# Patient Record
Sex: Female | Born: 1995 | Race: White | Hispanic: No | Marital: Single | Smoking: Never smoker
Health system: Southern US, Community
[De-identification: ages and names within clinical notes are randomized; demographics above are authoritative.]

## PROBLEM LIST (undated history)

## (undated) DIAGNOSIS — E079 Disorder of thyroid, unspecified: Secondary | ICD-10-CM

## (undated) HISTORY — DX: Disorder of thyroid, unspecified: E07.9

---
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
## 2017-01-17 DIAGNOSIS — L700 Acne vulgaris: Secondary | ICD-10-CM | POA: Diagnosis not present

## 2017-01-17 DIAGNOSIS — E611 Iron deficiency: Secondary | ICD-10-CM | POA: Diagnosis not present

## 2017-01-17 DIAGNOSIS — R5383 Other fatigue: Secondary | ICD-10-CM | POA: Diagnosis not present

## 2017-01-17 DIAGNOSIS — E559 Vitamin D deficiency, unspecified: Secondary | ICD-10-CM | POA: Diagnosis not present

## 2017-05-09 ENCOUNTER — Other Ambulatory Visit: Payer: Self-pay

## 2017-05-09 ENCOUNTER — Ambulatory Visit: Payer: BLUE CROSS/BLUE SHIELD | Admitting: Physician Assistant

## 2017-05-09 ENCOUNTER — Encounter: Payer: Self-pay | Admitting: Physician Assistant

## 2017-05-09 VITALS — BP 108/60 | HR 81 | Temp 98.1°F | Resp 18 | Ht 64.45 in | Wt 145.6 lb

## 2017-05-09 DIAGNOSIS — L710 Perioral dermatitis: Secondary | ICD-10-CM

## 2017-05-09 DIAGNOSIS — R21 Rash and other nonspecific skin eruption: Secondary | ICD-10-CM

## 2017-05-09 LAB — POCT SKIN KOH: Skin KOH, POC: NEGATIVE

## 2017-05-09 MED ORDER — METRONIDAZOLE 1 % EX GEL: Freq: Every day | CUTANEOUS | 0 refills | Status: DC

## 2017-05-09 NOTE — Patient Instructions (Addendum)
I am treating you today for Perioral dermatitis.   metronidazole gel once daily, and continue treatment for at least eight weeks. Improvement is often noted within this period, but longer periods of treatment may be required in some patients.  Other treatments are available. If topical doesn't work after 8 weeks, we can switch to oral.   Limit the use of moisturizers.   Perioral dermatitis (POD), also known as periorificial dermatitis, is a skin disorder that typically presents with multiple small inflammatory papules around the mouth, nose, or eyes. Although the name "perioral dermatitis" suggests a primarily eczematous condition, POD most often resembles an acneiform or rosacea-like eruption with or without associated features of a mild eczematous dermatitis. The pathogenesis of POD is poorly understood; both intrinsic factors and extrinsic factors, such as irritants and topical corticosteroids, may contribute to this disorder. Topical anti-inflammatory agents and topical or systemic antibiotics constitute the major pharmacologic options for therapy.  Come back and see me in 6-8 weeks.  Thank you for coming in today. I hope you feel we met your needs.  Feel free to call PCP if you have any questions or further requests.  Please consider signing up for MyChart if you do not already have it, as this is a great way to communicate with me.  Best,  Whitney McVey, PA-C  IF you received an x-ray today, you will receive an invoice from [facility]. Please contact [facility] at [phone] with questions or concerns regarding your invoice.   IF you received labwork today, you will receive an invoice from [facility]. Please contact [facility] at [phone] with questions or concerns regarding your invoice.   Our billing staff will not be able to assist you with questions regarding bills from these companies.  You will be contacted with the lab results as soon as they are available.  The fastest way to get your results is to activate your My Chart account. Instructions are located on the last page of this paperwork. If you have not heard from [place] regarding the results in 2 weeks, please contact this office.

## 2017-05-09 NOTE — Progress Notes (Signed)
   Verneda SkillJillian E Zapanta  MRN: [id number] DOB: [birth year]  PCP: Patient, No Pcp Per  Subjective:  Pt is a pleasant 21 year old female who presents to clinic for rash on face x 3 months.  Started around her mouth and is now on sides of both eye lids - mouth involvement has resolved. Dermatologist is Dr. Fredric MarePatricia Dupre in [place], [place] x 1.5 years.  Was treated for a "build up of yeast" with Ketoconazole.  She is taking Retinoin Topical acne medication.  She has tried tea tree oil - took inflammation away but rash still there. Apple cider vinegar helped a lot - but then didn't help.  Using water and wash cloth for cleansing.  Denies itching, pain, drainage, pustular appearance.   Hasn't noticed food association. Yeast-free diet did not help.    Review of Systems  Constitutional: Negative for chills, diaphoresis, fatigue and fever.  Respiratory: Negative for cough.   Skin: Positive for rash.    There are no active problems to display for this patient.   Current Outpatient Medications on File Prior to Visit  Medication Sig Dispense Refill  . Ascorbic Acid (VITAMIN C) 100 MG tablet Take 100 mg by mouth daily.    . IRON, FERROUS GLUCONATE, PO Take by mouth.    . spironolactone (ALDACTONE) 100 MG tablet Take 100 mg by mouth daily.    Marland Kitchen. doxycycline (VIBRAMYCIN) 100 MG capsule Take 1 capsule (100 mg total) by mouth 2 (two) times daily. (Patient not taking: Reported on 05/06/2015) 60 capsule 5  . Multiple Vitamins-Minerals (MULTIVITAMIN WITH MINERALS) tablet Take 1 tablet by mouth daily.    Marland Kitchen. thyroid (ARMOUR THYROID) 30 MG tablet Take 30 mg by mouth daily before breakfast.    . vitamin B-12 (CYANOCOBALAMIN) 100 MCG tablet Take 50 mcg by mouth daily.    Marland Kitchen. VITAMIN D, CHOLECALCIFEROL, PO Take by mouth.     No current facility-administered medications on file prior to visit.     No Known Allergies   Objective:  BP 108/60 (BP Location: Left Arm, Patient Position: Sitting, Cuff Size: Normal)    Pulse 81   Temp 98.1 F (36.7 C) (Oral)   Resp 18   Ht 5' 4.45" (1.637 m)   Wt 145 lb 9.6 oz (66 kg)   LMP 05/02/2017 (Approximate)   SpO2 98%   BMI 24.65 kg/m   Physical Exam  Constitutional: She is oriented to person, place, and time and well-developed, well-nourished, and in no distress. No distress.  Eyes: Conjunctivae are normal.    Several small pustular papular b/l upper eyelids. No drainage, erythema, scaling. No involvement of eye.   Neurological: She is alert and oriented to person, place, and time. GCS score is 15.  Skin: Skin is warm and dry.  Psychiatric: Mood, memory, affect and judgment normal.  Vitals reviewed.   Results for orders placed or performed in visit on 05/09/17  POCT Skin KOH  Result Value Ref Range   Skin KOH, POC Negative Negative    Assessment and Plan :  1. Perioral dermatitis 2. Rash and nonspecific skin eruption - POCT Skin KOH - metroNIDAZOLE (METROGEL) 1 % gel; Apply topically daily.  Dispense: 45 g; Refill: 0 - pt c/o rash on eyelids x 3 months. Suspect perioral dermatitis due to HPI. Plan to treat with metrogel. RTC in 8 weeks.   Marco CollieWhitney Azayla Polo, PA-C  Primary Care at [facility] 05/09/2017 6:12 PM

## 2017-07-11 ENCOUNTER — Encounter: Payer: Self-pay | Admitting: Emergency Medicine

## 2017-07-11 ENCOUNTER — Ambulatory Visit: Payer: BLUE CROSS/BLUE SHIELD | Admitting: Emergency Medicine

## 2017-07-11 ENCOUNTER — Ambulatory Visit (INDEPENDENT_AMBULATORY_CARE_PROVIDER_SITE_OTHER): Payer: BLUE CROSS/BLUE SHIELD

## 2017-07-11 VITALS — BP 106/76 | HR 66 | Temp 98.0°F | Resp 17 | Ht 66.0 in | Wt 152.0 lb

## 2017-07-11 DIAGNOSIS — M25561 Pain in right knee: Secondary | ICD-10-CM

## 2017-07-11 DIAGNOSIS — S86911A Strain of unspecified muscle(s) and tendon(s) at lower leg level, right leg, initial encounter: Secondary | ICD-10-CM

## 2017-07-11 NOTE — Progress Notes (Signed)
Rhonda Gonzales 22 y.o.   Chief Complaint  Patient presents with  . Knee Pain    right     HISTORY OF PRESENT ILLNESS: This is a 22 y.o. female complaining of pain to the right knee for several days.  May have injured it at work.  Probably a twisting injury.  Complaining of pain and swelling.  No fever, chills, redness, or signs of a septic joint.  HPI   Prior to Admission medications   Medication Sig Start Date End Date Taking? Authorizing Provider  Ascorbic Acid (VITAMIN C) 100 MG tablet Take 100 mg by mouth daily.   Yes [provider]  IRON, FERROUS GLUCONATE, PO Take by mouth.   Yes [provider]  metroNIDAZOLE (METROGEL) 1 % gel Apply topically daily. 05/09/17  Yes McVey, Madelaine Bhat, PA-C  Multiple Vitamins-Minerals (MULTIVITAMIN WITH MINERALS) tablet Take 1 tablet by mouth daily.   Yes [provider]  spironolactone (ALDACTONE) 100 MG tablet Take 100 mg by mouth daily.   Yes [provider]  thyroid (ARMOUR THYROID) 30 MG tablet Take 30 mg by mouth daily before breakfast.   Yes [provider]  vitamin B-12 (CYANOCOBALAMIN) 100 MCG tablet Take 50 mcg by mouth daily.   Yes [provider]  VITAMIN D, CHOLECALCIFEROL, PO Take by mouth.   Yes [provider]    No Known Allergies  There are no active problems to display for this patient.   Past Medical History:  Diagnosis Date  . Thyroid disease     No past surgical history on file.  Social History   Socioeconomic History  . Marital status: Single    Spouse name: Not on file  . Number of children: Not on file  . Years of education: Not on file  . Highest education level: Not on file  Social Needs  . Financial resource strain: Not on file  . Food insecurity - worry: Not on file  . Food insecurity - inability: Not on file  . Transportation needs - medical: Not on file  . Transportation needs - non-medical: Not on file  Occupational History   . Not on file  Tobacco Use  . Smoking status: Never Smoker  . Smokeless tobacco: Never Used  Substance and Sexual Activity  . Alcohol use: No  . Drug use: No  . Sexual activity: No    Birth control/protection: Abstinence  Other Topics Concern  . Not on file  Social History Narrative  . Not on file    Family History  Problem Relation Age of Onset  . Heart disease Paternal Grandmother   . Diabetes Paternal Grandmother   . Heart disease Paternal Grandfather      Review of Systems  Constitutional: Negative.  Negative for chills and fever.  HENT: Negative.  Negative for sore throat.   Eyes: Negative.  Negative for discharge and redness.  Respiratory: Negative.  Negative for cough and shortness of breath.   Cardiovascular: Negative.  Negative for chest pain, palpitations and leg swelling.  Gastrointestinal: Negative.  Negative for nausea and vomiting.  Genitourinary: Negative.  Negative for dysuria and hematuria.  Musculoskeletal: Positive for joint pain (Right knee).  Skin: Negative.  Negative for rash.  Neurological: Negative.  Negative for dizziness, sensory change, focal weakness and headaches.  Endo/Heme/Allergies: Negative.   All other systems reviewed and are negative.  Vitals:   07/11/17 1721  BP: 106/76  Pulse: 66  Resp: 17  Temp: 98 F (36.7 C)  SpO2: 98%     Physical Exam  Constitutional: She is oriented to person, place, and time. She appears well-developed and well-nourished.  HENT:  Head: Normocephalic and atraumatic.  Eyes: EOM are normal. Pupils are equal, round, and reactive to light.  Neck: Normal range of motion.  Cardiovascular: Normal rate.  Pulmonary/Chest: Effort normal.  Musculoskeletal:  Right knee: No bruising or erythema.  Mild swelling.  No localized tenderness.  Stable in flexion and extension.  Full range of motion.  Neurological: She is alert and oriented to person, place, and time. No sensory deficit. She exhibits normal muscle  tone.  Skin: Skin is warm. Capillary refill takes less than 2 seconds.  Psychiatric: She has a normal mood and affect. Her behavior is normal.  Vitals reviewed.  Dg Knee Complete 4 Views Right  Result Date: 07/11/2017 CLINICAL DATA:  Acute pain of the right knee for the past 6 days. EXAM: RIGHT KNEE - COMPLETE 4+ VIEW COMPARISON:  Right knee radiograph 09/27/2010. FINDINGS: No evidence of fracture, dislocation, or joint effusion. Previously noted lucent lesion in the proximal tibial metadiaphyseal region is now mildly sclerotic, compatible with a healed fibrous cortical defect. No evidence of arthropathy. Soft tissues are unremarkable. IMPRESSION: 1. No acute radiographic abnormality of the right knee. Electronically Signed   By: Trudie Reedaniel  Entrikin M.D.   On: 07/11/2017 17:52     ASSESSMENT & PLAN: Rhonda GlanceJillian was seen today for knee pain.  Diagnoses and all orders for this visit:  Acute pain of right knee -     DG Knee Complete 4 Views Right; Future  Knee strain, right, initial encounter    Patient Instructions       IF you received an x-ray today, you will receive an invoice from [facility]. Please contact [facility] at [phone] with questions or concerns regarding your invoice.   IF you received labwork today, you will receive an invoice from [facility]. Please contact [facility] at [phone] with questions or concerns regarding your invoice.   Our billing staff will not be able to assist you with questions regarding bills from these companies.  You will be contacted with the lab results as soon as they are available. The fastest way to get your results is to activate your My Chart account. Instructions are located on the last page of this paperwork. If you have not heard from us regarding the results in 2 weeks, please contact this office.      Knee Pain, Adult Many things can cause knee pain. The pain often goes away on its own with time and rest. If the  pain does not go away, tests may be done to find out what is causing the pain. Follow these instructions at home: Activity  Rest your knee.  Do not do things that cause pain.  Avoid activities where both feet leave the ground at the same time (high-impact activities). Examples are running, jumping rope, and doing jumping jacks. General instructions  Take medicines only as told by your doctor.  Raise (elevate) your knee when you are resting. Make sure your knee is higher than your heart.  Sleep with a pillow under your knee.  If told, put ice on the knee: ? Put ice in a plastic bag. ? Place a towel between your skin and the bag. ? Leave the ice on for 20 minutes, 2-3 times a day.  Ask your doctor if you should wear an elastic knee support.  Lose weight if you are overweight. Being  overweight can make your knee hurt more.  Do not use any tobacco products. These include cigarettes, chewing tobacco, or electronic cigarettes. If you need help quitting, ask your doctor. Smoking may slow down healing. Contact a doctor if:  The pain does not stop.  The pain changes or gets worse.  You have a fever along with knee pain.  Your knee gives out or locks up.  Your knee swells, and becomes worse. Get help right away if:  Your knee feels warm.  You cannot move your knee.  You have very bad knee pain.  You have chest pain.  You have trouble breathing. Summary  Many things can cause knee pain. The pain often goes away on its own with time and rest.  Avoid activities that put stress on your knee. These include running and jumping rope.  Get help right away if you cannot move your knee, or if your knee feels warm, or if you have trouble breathing. This information is not intended to replace advice given to you by your health care provider. Make sure you discuss any questions you have with your health care provider. Document Released: 08/11/2008 Document Revised: 05/09/2016  Document Reviewed: 05/09/2016 Elsevier Interactive Patient Education  2017 Elsevier Inc.      Edwina Barth, MD [facility]

## 2017-07-11 NOTE — Patient Instructions (Addendum)
     IF you received an x-ray today, you will receive an invoice from [facility]. Please contact [facility] at [phone] with questions or concerns regarding your invoice.   IF you received labwork today, you will receive an invoice from [facility]. Please contact [facility] at [phone] with questions or concerns regarding your invoice.   Our billing staff will not be able to assist you with questions regarding bills from these companies.  You will be contacted with the lab results as soon as they are available. The fastest way to get your results is to activate your My Chart account. Instructions are located on the last page of this paperwork. If you have not heard from us regarding the results in 2 weeks, please contact this office.      Knee Pain, Adult Many things can cause knee pain. The pain often goes away on its own with time and rest. If the pain does not go away, tests may be done to find out what is causing the pain. Follow these instructions at home: Activity  Rest your knee.  Do not do things that cause pain.  Avoid activities where both feet leave the ground at the same time (high-impact activities). Examples are running, jumping rope, and doing jumping jacks. General instructions  Take medicines only as told by your doctor.  Raise (elevate) your knee when you are resting. Make sure your knee is higher than your heart.  Sleep with a pillow under your knee.  If told, put ice on the knee: ? Put ice in a plastic bag. ? Place a towel between your skin and the bag. ? Leave the ice on for 20 minutes, 2-3 times a day.  Ask your doctor if you should wear an elastic knee support.  Lose weight if you are overweight. Being overweight can make your knee hurt more.  Do not use any tobacco products. These include cigarettes, chewing tobacco, or electronic cigarettes. If you need help quitting, ask your doctor. Smoking may slow down healing. Contact a  doctor if:  The pain does not stop.  The pain changes or gets worse.  You have a fever along with knee pain.  Your knee gives out or locks up.  Your knee swells, and becomes worse. Get help right away if:  Your knee feels warm.  You cannot move your knee.  You have very bad knee pain.  You have chest pain.  You have trouble breathing. Summary  Many things can cause knee pain. The pain often goes away on its own with time and rest.  Avoid activities that put stress on your knee. These include running and jumping rope.  Get help right away if you cannot move your knee, or if your knee feels warm, or if you have trouble breathing. This information is not intended to replace advice given to you by your health care provider. Make sure you discuss any questions you have with your health care provider. Document Released: 08/11/2008 Document Revised: 05/09/2016 Document Reviewed: 05/09/2016 Elsevier Interactive Patient Education  2017 Elsevier Inc.  

## 2017-08-29 ENCOUNTER — Other Ambulatory Visit: Payer: Self-pay

## 2017-08-29 ENCOUNTER — Encounter: Payer: Self-pay | Admitting: Emergency Medicine

## 2017-08-29 ENCOUNTER — Ambulatory Visit: Payer: BLUE CROSS/BLUE SHIELD | Admitting: Emergency Medicine

## 2017-08-29 VITALS — BP 100/60 | HR 69 | Resp 18 | Ht 66.0 in | Wt 153.4 lb

## 2017-08-29 DIAGNOSIS — M25561 Pain in right knee: Secondary | ICD-10-CM

## 2017-08-29 DIAGNOSIS — S86911D Strain of unspecified muscle(s) and tendon(s) at lower leg level, right leg, subsequent encounter: Secondary | ICD-10-CM | POA: Diagnosis not present

## 2017-08-29 NOTE — Progress Notes (Signed)
Rhonda Gonzales 22 y.o.   Chief Complaint  Patient presents with  . Knee Swelling    right knee, pt states swelling has been going on since February. Pt states she doesn't have any pain when she walks just some discomfort. Pt states knee is not discolored or warm to the touch.    HISTORY OF PRESENT ILLNESS: This is a 22 y.o. female here for follow-up of right knee injury sustained about 2 months ago.  Complaining of persistent pain and swelling.  No new injury.  No new symptoms.  HPI   Prior to Admission medications   Medication Sig Start Date End Date Taking? Authorizing Provider  Ascorbic Acid (VITAMIN C) 100 MG tablet Take 100 mg by mouth daily.   Yes [provider]  IRON, FERROUS GLUCONATE, PO Take by mouth.   Yes [provider]  metroNIDAZOLE (METROGEL) 1 % gel Apply topically daily. 05/09/17  Yes McVey, Madelaine Bhat, PA-C  Multiple Vitamins-Minerals (MULTIVITAMIN WITH MINERALS) tablet Take 1 tablet by mouth daily.   Yes [provider]  spironolactone (ALDACTONE) 100 MG tablet Take 100 mg by mouth daily.   Yes [provider]  thyroid (ARMOUR THYROID) 30 MG tablet Take 30 mg by mouth daily before breakfast.   Yes [provider]  vitamin B-12 (CYANOCOBALAMIN) 100 MCG tablet Take 50 mcg by mouth daily.   Yes [provider]  VITAMIN D, CHOLECALCIFEROL, PO Take by mouth.   Yes [provider]    No Known Allergies  Patient Active Problem List   Diagnosis Date Noted  . Acute pain of right knee 07/11/2017  . Knee strain, right, initial encounter 07/11/2017    Past Medical History:  Diagnosis Date  . Thyroid disease     History reviewed. No pertinent surgical history.  Social History   Socioeconomic History  . Marital status: Single    Spouse name: Not on file  . Number of children: Not on file  . Years of education: Not on file  . Highest education level: Not on file  Occupational History  .  Not on file  Social Needs  . Financial resource strain: Not on file  . Food insecurity:    Worry: Not on file    Inability: Not on file  . Transportation needs:    Medical: Not on file    Non-medical: Not on file  Tobacco Use  . Smoking status: Never Smoker  . Smokeless tobacco: Never Used  Substance and Sexual Activity  . Alcohol use: No  . Drug use: No  . Sexual activity: Never    Birth control/protection: Abstinence  Lifestyle  . Physical activity:    Days per week: Not on file    Minutes per session: Not on file  . Stress: Not on file  Relationships  . Social connections:    Talks on phone: Not on file    Gets together: Not on file    Attends religious service: Not on file    Active member of club or organization: Not on file    Attends meetings of clubs or organizations: Not on file    Relationship status: Not on file  . Intimate partner violence:    Fear of current or ex partner: Not on file    Emotionally abused: Not on file    Physically abused: Not on file    Forced sexual activity: Not on file  Other Topics Concern  . Not on file  Social History Narrative  .  Not on file    Family History  Problem Relation Age of Onset  . Heart disease Paternal Grandmother   . Diabetes Paternal Grandmother   . Heart disease Paternal Grandfather      Review of Systems  Constitutional: Negative.  Negative for chills and fever.  HENT: Negative for sore throat.   Respiratory: Negative for shortness of breath.   Cardiovascular: Negative for chest pain.  Gastrointestinal: Negative for abdominal pain, nausea and vomiting.  Musculoskeletal: Positive for joint pain (Right knee).  Skin: Negative.  Negative for rash.  Neurological: Negative.  Negative for sensory change and focal weakness.  All other systems reviewed and are negative.     Vitals:   08/29/17 1741  BP: 100/60  Pulse: 69  Resp: 18  SpO2: 100%    Physical Exam  Constitutional: She is oriented to  person, place, and time. She appears well-developed and well-nourished.  HENT:  Head: Normocephalic and atraumatic.  Eyes: Pupils are equal, round, and reactive to light.  Neck: Normal range of motion.  Cardiovascular: Normal rate.  Pulmonary/Chest: Effort normal.  Musculoskeletal:  Right knee: No erythema or significant swelling.  No bruising.  Full range of motion.  Stable in flexion and extension.  Neurological: She is alert and oriented to person, place, and time. No sensory deficit. She exhibits normal muscle tone.  Skin: Skin is warm and dry. Capillary refill takes less than 2 seconds.  Psychiatric: She has a normal mood and affect. Her behavior is normal.  Vitals reviewed.    ASSESSMENT & PLAN:  Rhonda Gonzales was seen today for knee swelling.  Diagnoses and all orders for this visit:  Acute pain of right knee -     MR Knee Right Wo Contrast; Future -     Ambulatory referral to Orthopedic Surgery  Strain of right knee, subsequent encounter    Patient Instructions       IF you received an x-ray today, you will receive an invoice from [facility]. Please contact [facility] at [phone] with questions or concerns regarding your invoice.   IF you received labwork today, you will receive an invoice from [facility]. Please contact [facility] at [phone] with questions or concerns regarding your invoice.   Our billing staff will not be able to assist you with questions regarding bills from these companies.  You will be contacted with the lab results as soon as they are available. The fastest way to get your results is to activate your My Chart account. Instructions are located on the last page of this paperwork. If you have not heard from [place] regarding the results in 2 weeks, please contact this office.     Knee Pain, Adult Many things can cause knee pain. The pain often goes away on its own with time and rest. If the pain does not go away, tests may be  done to find out what is causing the pain. Follow these instructions at home: Activity  Rest your knee.  Do not do things that cause pain.  Avoid activities where both feet leave the ground at the same time (high-impact activities). Examples are running, jumping rope, and doing jumping jacks. General instructions  Take medicines only as told by your doctor.  Raise (elevate) your knee when you are resting. Make sure your knee is higher than your heart.  Sleep with a pillow under your knee.  If told, put ice on the knee: ? Put ice in a plastic bag. ? Place a towel between  your skin and the bag. ? Leave the ice on for 20 minutes, 2-3 times a day.  Ask your doctor if you should wear an elastic knee support.  Lose weight if you are overweight. Being overweight can make your knee hurt more.  Do not use any tobacco products. These include cigarettes, chewing tobacco, or electronic cigarettes. If you need help quitting, ask your doctor. Smoking may slow down healing. Contact a doctor if:  The pain does not stop.  The pain changes or gets worse.  You have a fever along with knee pain.  Your knee gives out or locks up.  Your knee swells, and becomes worse. Get help right away if:  Your knee feels warm.  You cannot move your knee.  You have very bad knee pain.  You have chest pain.  You have trouble breathing. Summary  Many things can cause knee pain. The pain often goes away on its own with time and rest.  Avoid activities that put stress on your knee. These include running and jumping rope.  Get help right away if you cannot move your knee, or if your knee feels warm, or if you have trouble breathing. This information is not intended to replace advice given to you by your health care provider. Make sure you discuss any questions you have with your health care provider. Document Released: 08/11/2008 Document Revised: 05/09/2016 Document Reviewed: 05/09/2016 Elsevier  Interactive Patient Education  2017 Elsevier Inc.     Edwina BarthMiguel Dakotah Orrego, MD [facility]

## 2017-08-29 NOTE — Patient Instructions (Addendum)
     IF you received an x-ray today, you will receive an invoice from [facility]. Please contact [facility] at [phone] with questions or concerns regarding your invoice.   IF you received labwork today, you will receive an invoice from [facility]. Please contact [facility] at [phone] with questions or concerns regarding your invoice.   Our billing staff will not be able to assist you with questions regarding bills from these companies.  You will be contacted with the lab results as soon as they are available. The fastest way to get your results is to activate your My Chart account. Instructions are located on the last page of this paperwork. If you have not heard from us regarding the results in 2 weeks, please contact this office.      Knee Pain, Adult Many things can cause knee pain. The pain often goes away on its own with time and rest. If the pain does not go away, tests may be done to find out what is causing the pain. Follow these instructions at home: Activity  Rest your knee.  Do not do things that cause pain.  Avoid activities where both feet leave the ground at the same time (high-impact activities). Examples are running, jumping rope, and doing jumping jacks. General instructions  Take medicines only as told by your doctor.  Raise (elevate) your knee when you are resting. Make sure your knee is higher than your heart.  Sleep with a pillow under your knee.  If told, put ice on the knee: ? Put ice in a plastic bag. ? Place a towel between your skin and the bag. ? Leave the ice on for 20 minutes, 2-3 times a day.  Ask your doctor if you should wear an elastic knee support.  Lose weight if you are overweight. Being overweight can make your knee hurt more.  Do not use any tobacco products. These include cigarettes, chewing tobacco, or electronic cigarettes. If you need help quitting, ask your doctor. Smoking may slow down healing. Contact a  doctor if:  The pain does not stop.  The pain changes or gets worse.  You have a fever along with knee pain.  Your knee gives out or locks up.  Your knee swells, and becomes worse. Get help right away if:  Your knee feels warm.  You cannot move your knee.  You have very bad knee pain.  You have chest pain.  You have trouble breathing. Summary  Many things can cause knee pain. The pain often goes away on its own with time and rest.  Avoid activities that put stress on your knee. These include running and jumping rope.  Get help right away if you cannot move your knee, or if your knee feels warm, or if you have trouble breathing. This information is not intended to replace advice given to you by your health care provider. Make sure you discuss any questions you have with your health care provider. Document Released: 08/11/2008 Document Revised: 05/09/2016 Document Reviewed: 05/09/2016 Elsevier Interactive Patient Education  2017 Elsevier Inc.  

## 2017-09-24 DIAGNOSIS — E611 Iron deficiency: Secondary | ICD-10-CM | POA: Diagnosis not present

## 2017-09-24 DIAGNOSIS — R5383 Other fatigue: Secondary | ICD-10-CM | POA: Diagnosis not present

## 2017-09-24 DIAGNOSIS — L700 Acne vulgaris: Secondary | ICD-10-CM | POA: Diagnosis not present

## 2017-09-24 DIAGNOSIS — E559 Vitamin D deficiency, unspecified: Secondary | ICD-10-CM | POA: Diagnosis not present

## 2017-11-20 DIAGNOSIS — E559 Vitamin D deficiency, unspecified: Secondary | ICD-10-CM | POA: Diagnosis not present

## 2018-02-26 IMAGING — DX DG KNEE COMPLETE 4+V*R*
4 series · 4 of 4 positions shown · non-contrast
Comparison: Right knee radiograph 09/27/2010.

CLINICAL DATA: Acute pain of the right knee for the past 6 days.

EXAM:
RIGHT KNEE - COMPLETE 4+ VIEW

[knee ap]
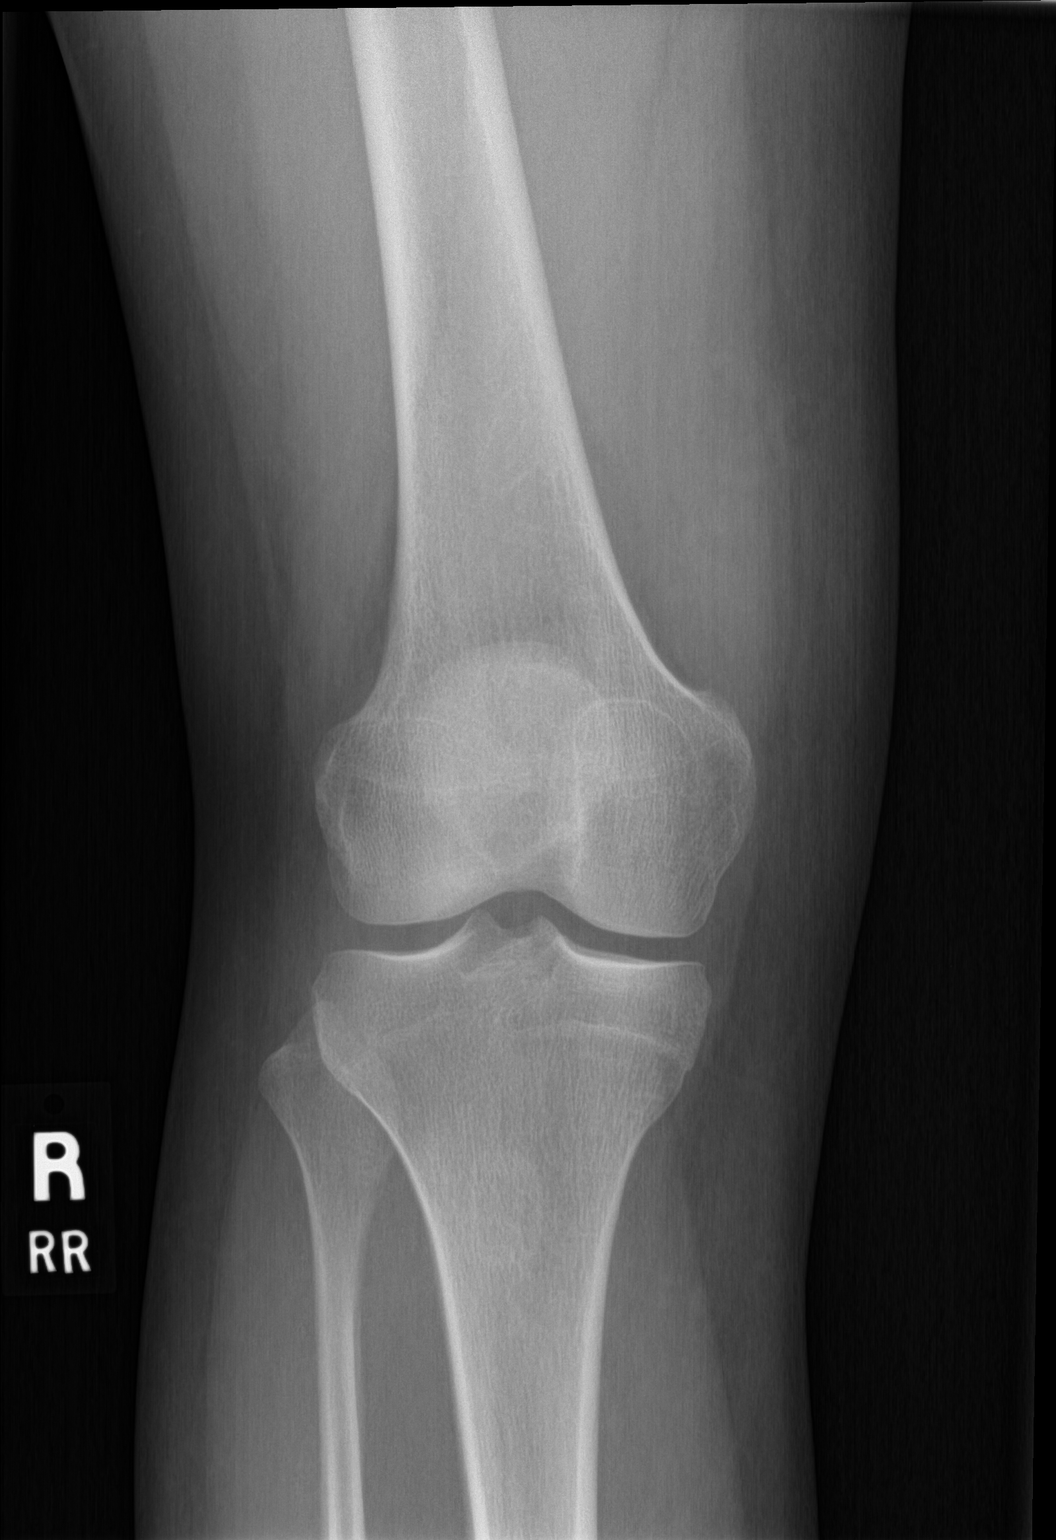

[knee obl (1 of 2)]
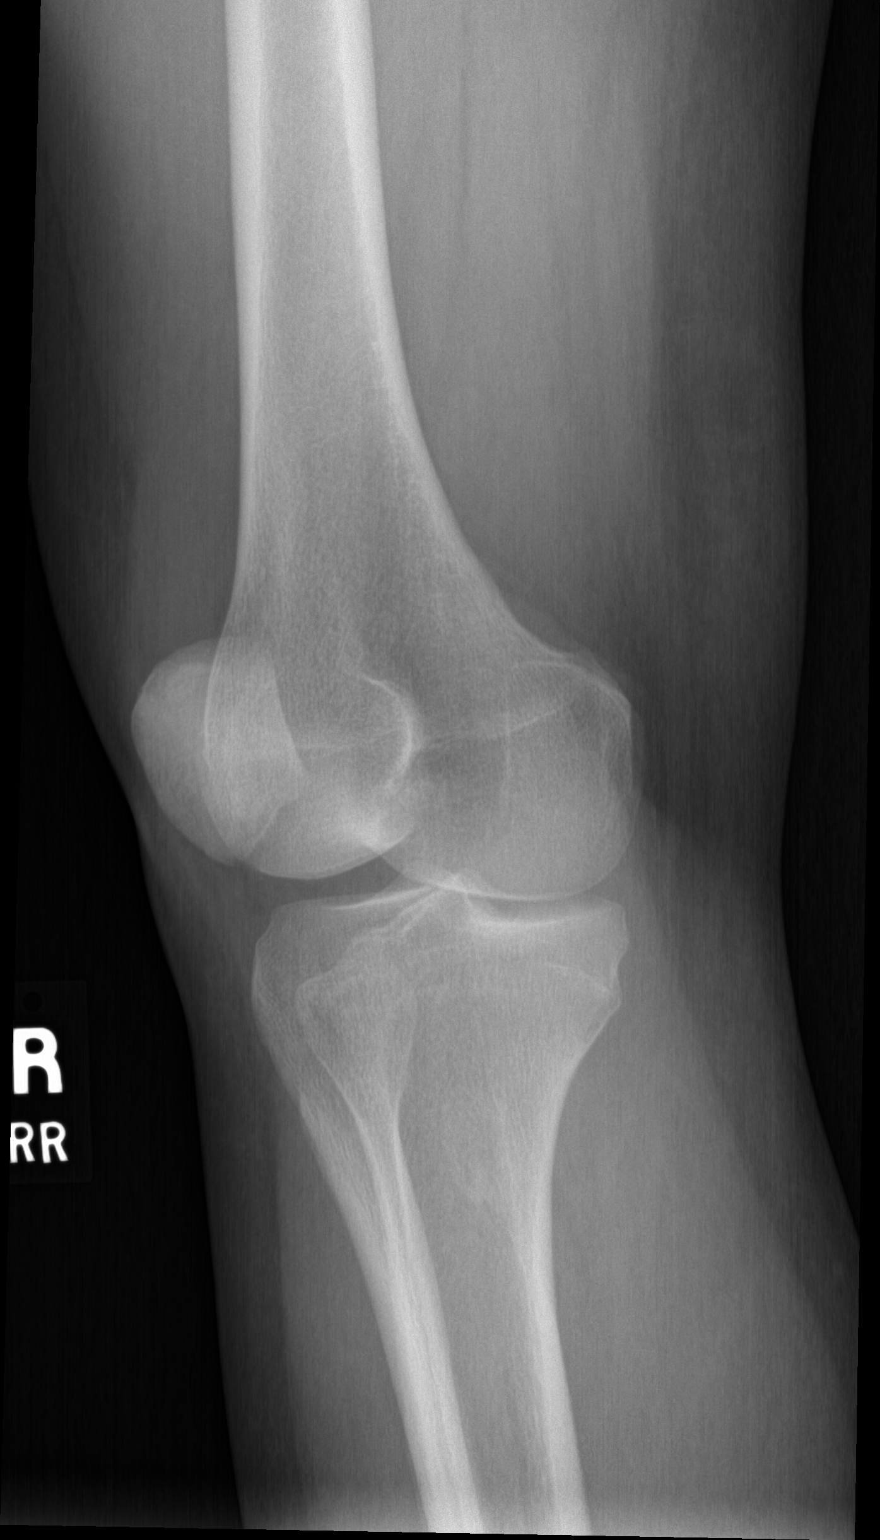

[knee obl (2 of 2)]
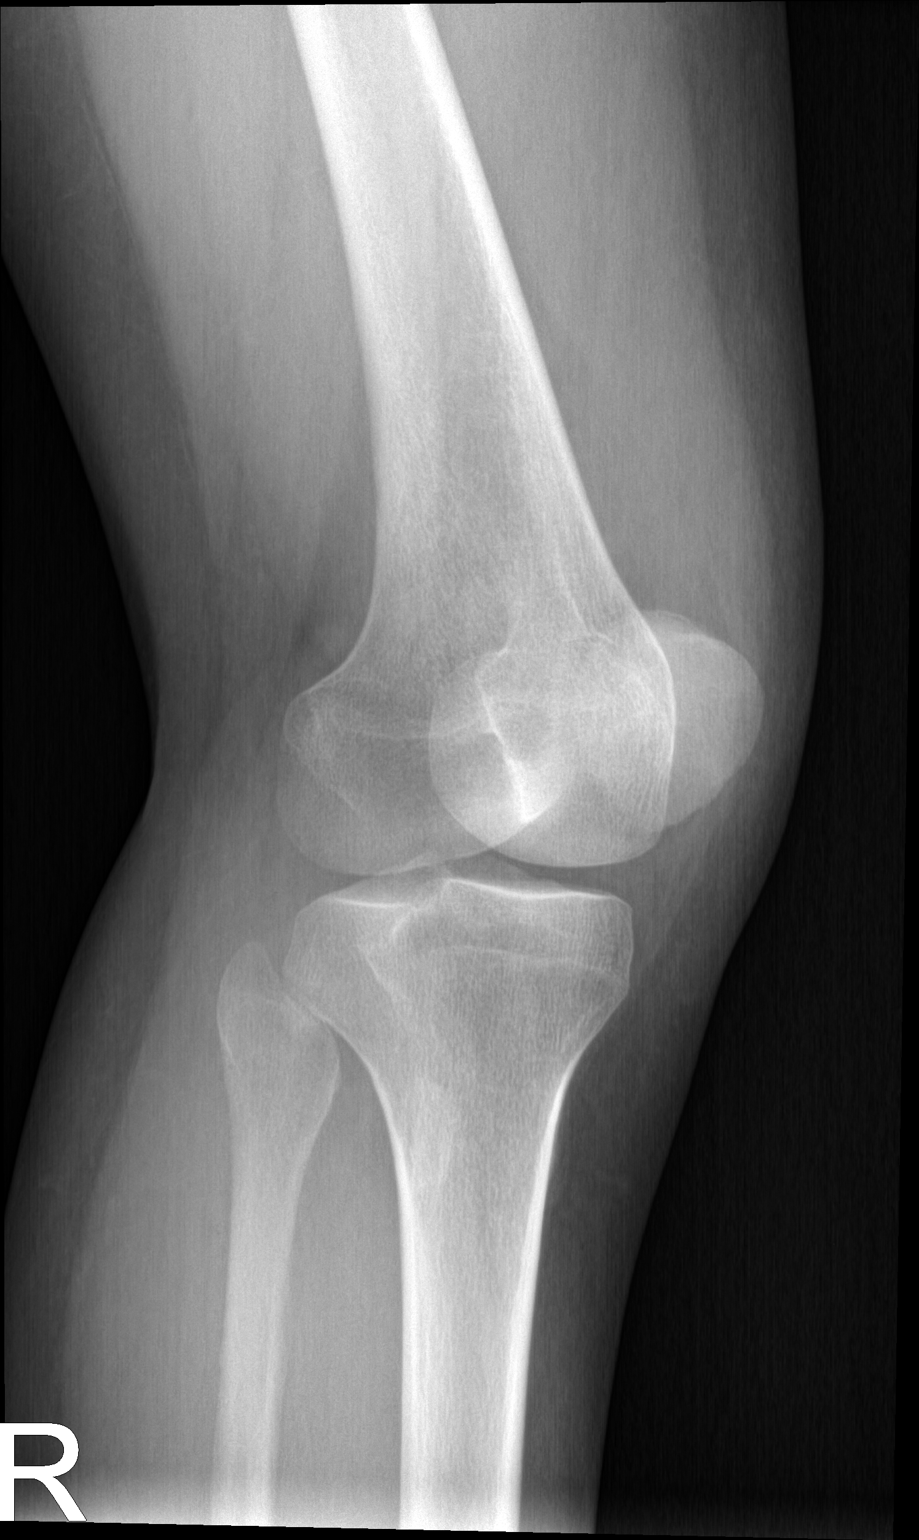

[knee lat]
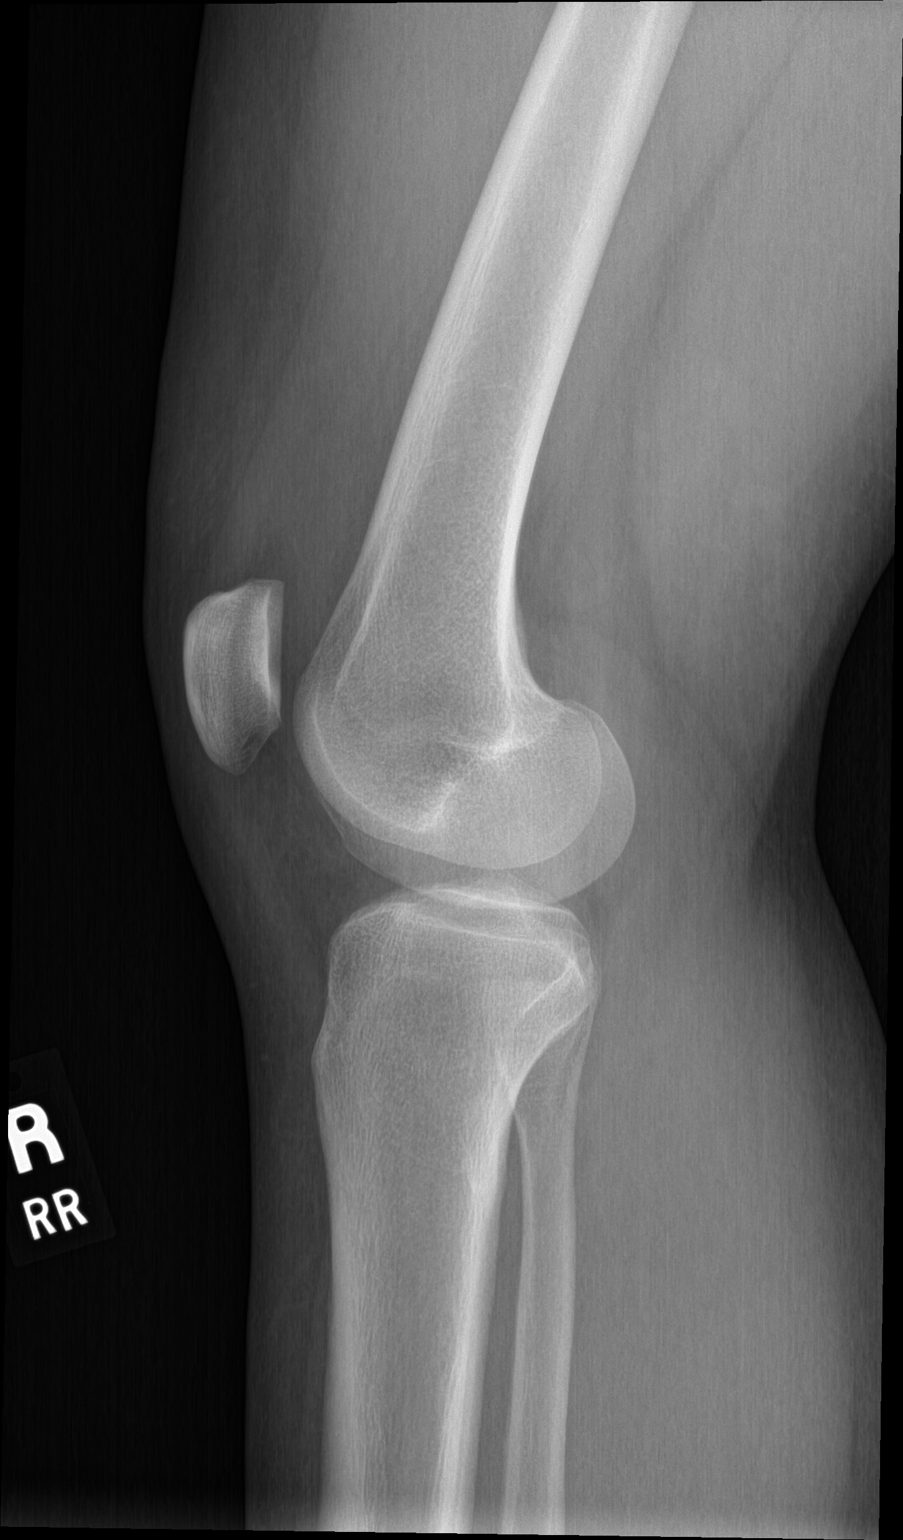

[4 of 4 positions shown; findings below may reference images not displayed]

FINDINGS: No evidence of fracture, dislocation, or joint effusion. Previously
noted lucent lesion in the proximal tibial metadiaphyseal region is
now mildly sclerotic, compatible with a healed fibrous cortical
defect. No evidence of arthropathy. Soft tissues are unremarkable.
IMPRESSION: 1. No acute radiographic abnormality of the right knee.

## 2018-08-01 DIAGNOSIS — E039 Hypothyroidism, unspecified: Secondary | ICD-10-CM | POA: Diagnosis not present

## 2018-08-01 DIAGNOSIS — E785 Hyperlipidemia, unspecified: Secondary | ICD-10-CM | POA: Diagnosis not present

## 2018-08-01 DIAGNOSIS — I10 Essential (primary) hypertension: Secondary | ICD-10-CM | POA: Diagnosis not present

## 2018-08-01 DIAGNOSIS — L708 Other acne: Secondary | ICD-10-CM | POA: Diagnosis not present

## 2018-08-01 DIAGNOSIS — Z113 Encounter for screening for infections with a predominantly sexual mode of transmission: Secondary | ICD-10-CM | POA: Diagnosis not present

## 2018-08-01 DIAGNOSIS — R531 Weakness: Secondary | ICD-10-CM | POA: Diagnosis not present

## 2018-08-01 DIAGNOSIS — R7309 Other abnormal glucose: Secondary | ICD-10-CM | POA: Diagnosis not present

## 2018-08-01 DIAGNOSIS — E7849 Other hyperlipidemia: Secondary | ICD-10-CM | POA: Diagnosis not present

## 2018-08-01 DIAGNOSIS — G478 Other sleep disorders: Secondary | ICD-10-CM | POA: Diagnosis not present

## 2018-08-01 DIAGNOSIS — Z0184 Encounter for antibody response examination: Secondary | ICD-10-CM | POA: Diagnosis not present

## 2018-08-01 DIAGNOSIS — Z1159 Encounter for screening for other viral diseases: Secondary | ICD-10-CM | POA: Diagnosis not present

## 2018-08-01 DIAGNOSIS — B009 Herpesviral infection, unspecified: Secondary | ICD-10-CM | POA: Diagnosis not present

## 2018-08-09 DIAGNOSIS — N390 Urinary tract infection, site not specified: Secondary | ICD-10-CM | POA: Diagnosis not present

## 2018-08-15 DIAGNOSIS — E538 Deficiency of other specified B group vitamins: Secondary | ICD-10-CM | POA: Diagnosis not present

## 2018-08-15 DIAGNOSIS — Z202 Contact with and (suspected) exposure to infections with a predominantly sexual mode of transmission: Secondary | ICD-10-CM | POA: Diagnosis not present

## 2018-08-15 DIAGNOSIS — R05 Cough: Secondary | ICD-10-CM | POA: Diagnosis not present

## 2018-08-15 DIAGNOSIS — R531 Weakness: Secondary | ICD-10-CM | POA: Diagnosis not present

## 2019-04-01 DIAGNOSIS — L700 Acne vulgaris: Secondary | ICD-10-CM | POA: Diagnosis not present

## 2019-04-01 DIAGNOSIS — R21 Rash and other nonspecific skin eruption: Secondary | ICD-10-CM | POA: Diagnosis not present

## 2019-04-01 DIAGNOSIS — R5383 Other fatigue: Secondary | ICD-10-CM | POA: Diagnosis not present

## 2019-04-01 DIAGNOSIS — E559 Vitamin D deficiency, unspecified: Secondary | ICD-10-CM | POA: Diagnosis not present

## 2019-05-09 DIAGNOSIS — Z20828 Contact with and (suspected) exposure to other viral communicable diseases: Secondary | ICD-10-CM | POA: Diagnosis not present

## 2019-07-21 DIAGNOSIS — L700 Acne vulgaris: Secondary | ICD-10-CM | POA: Diagnosis not present

## 2019-10-24 DIAGNOSIS — Z20822 Contact with and (suspected) exposure to covid-19: Secondary | ICD-10-CM | POA: Diagnosis not present

## 2019-10-24 DIAGNOSIS — R05 Cough: Secondary | ICD-10-CM | POA: Diagnosis not present

## 2019-10-24 DIAGNOSIS — J189 Pneumonia, unspecified organism: Secondary | ICD-10-CM | POA: Diagnosis not present

## 2019-10-24 DIAGNOSIS — R070 Pain in throat: Secondary | ICD-10-CM | POA: Diagnosis not present

## 2019-10-24 DIAGNOSIS — Z6823 Body mass index (BMI) 23.0-23.9, adult: Secondary | ICD-10-CM | POA: Diagnosis not present

## 2020-02-25 ENCOUNTER — Ambulatory Visit: Admission: EM | Admit: 2020-02-25 | Discharge: 2020-02-25 | Discharge status: Absent without leave | Payer: Self-pay

## 2020-05-14 ENCOUNTER — Other Ambulatory Visit: Payer: Self-pay

## 2020-05-14 DIAGNOSIS — Z20822 Contact with and (suspected) exposure to covid-19: Secondary | ICD-10-CM

## 2020-05-15 LAB — NOVEL CORONAVIRUS, NAA: SARS-CoV-2, NAA: NOT DETECTED

## 2020-05-15 LAB — SARS-COV-2, NAA 2 DAY TAT

## 2021-10-22 ENCOUNTER — Ambulatory Visit
Admission: EM | Admit: 2021-10-22 | Discharge: 2021-10-22 | Disposition: A | Discharge status: Home / Self Care | Payer: 59 | Attending: Urgent Care | Admitting: Urgent Care

## 2021-10-22 DIAGNOSIS — B3731 Acute candidiasis of vulva and vagina: Secondary | ICD-10-CM | POA: Insufficient documentation

## 2021-10-22 MED ORDER — FLUCONAZOLE 150 MG PO TABS: 150.0000 mg | ORAL_TABLET | ORAL | 0 refills | Status: DC

## 2021-10-22 NOTE — ED Provider Notes (Signed)
[facility]   MRN: [id number] DOB: 08/25/1995  Subjective:   Rhonda Gonzales is a 26 y.o. female presenting for several day history of acute onset recurrent vaginal discharge that she is concerned for having a yeast infection.  Has previously had difficulty with this and knows what it feels like.  She does not have concern that it is bacterial vaginosis but would like to be tested for everything including STIs.  She previously had a lot of yeast infections from long-term antibiotic use.  She has since stopped doing this.  She was seen recently and prescribed nystatin which did not help.  She is not opposed to oral fluconazole.  No urinary symptoms, fever, nausea, vomiting, abdominal or pelvic pain, genital rash.  No current facility-administered medications for this encounter.  Current Outpatient Medications:    Ascorbic Acid (VITAMIN C) 100 MG tablet, Take 100 mg by mouth daily., Disp: , Rfl:    IRON, FERROUS GLUCONATE, PO, Take by mouth., Disp: , Rfl:    metroNIDAZOLE (METROGEL) 1 % gel, Apply topically daily., Disp: 45 g, Rfl: 0   Multiple Vitamins-Minerals (MULTIVITAMIN WITH MINERALS) tablet, Take 1 tablet by mouth daily., Disp: , Rfl:    spironolactone (ALDACTONE) 100 MG tablet, Take 100 mg by mouth daily., Disp: , Rfl:    thyroid (ARMOUR THYROID) 30 MG tablet, Take 30 mg by mouth daily before breakfast., Disp: , Rfl:    vitamin B-12 (CYANOCOBALAMIN) 100 MCG tablet, Take 50 mcg by mouth daily., Disp: , Rfl:    VITAMIN D, CHOLECALCIFEROL, PO, Take by mouth., Disp: , Rfl:    No Known Allergies  Past Medical History:  Diagnosis Date   Thyroid disease      No past surgical history on file.  Family History  Problem Relation Age of Onset   Heart disease Paternal Grandmother    Diabetes Paternal Grandmother    Heart disease Paternal Grandfather     Social History   Tobacco Use   Smoking status: Never   Smokeless tobacco: Never  Vaping Use   Vaping  Use: Never used  Substance Use Topics   Alcohol use: No   Drug use: No    ROS   Objective:   Vitals: BP 99/65 (BP Location: Right Arm)   Pulse 61   Temp (!) 97.5 F (36.4 C) (Oral)   Resp 16   LMP 10/15/2021   SpO2 98%   Physical Exam Constitutional:      General: She is not in acute distress.    Appearance: Normal appearance. She is well-developed. She is not ill-appearing, toxic-appearing or diaphoretic.  HENT:     Head: Normocephalic and atraumatic.     Nose: Nose normal.     Mouth/Throat:     Mouth: Mucous membranes are moist.  Eyes:     General: No scleral icterus.       Right eye: No discharge.        Left eye: No discharge.     Extraocular Movements: Extraocular movements intact.  Cardiovascular:     Rate and Rhythm: Normal rate.  Pulmonary:     Effort: Pulmonary effort is normal.  Skin:    General: Skin is warm and dry.  Neurological:     General: No focal deficit present.     Mental Status: She is alert and oriented to person, place, and time.  Psychiatric:        Mood and Affect: Mood normal.  Behavior: Behavior normal.    Assessment and Plan :   PDMP not reviewed this encounter.  1. Yeast vaginitis    Recommended oral fluconazole for empiric treatment. Will treat otherwise based off of her results. Counseled patient on potential for adverse effects with medications prescribed/recommended today, ER and return-to-clinic precautions discussed, patient verbalized understanding.    Wallis Bamberg, PA-C 10/22/21 1527

## 2021-10-22 NOTE — ED Triage Notes (Signed)
Pt c/o vaginal discharge, fatigue and digestive issues. She states she has been dealing with yeast infections for years. She states she was prescribed nystatin that is not helpful.

## 2021-10-26 LAB — CERVICOVAGINAL ANCILLARY ONLY
Bacterial Vaginitis (gardnerella): NEGATIVE
Candida Glabrata: NEGATIVE
Candida Vaginitis: NEGATIVE
Chlamydia: NEGATIVE
Comment: NEGATIVE
Comment: NEGATIVE
Comment: NEGATIVE
Comment: NEGATIVE
Comment: NEGATIVE
Comment: NORMAL
Neisseria Gonorrhea: NEGATIVE
Trichomonas: NEGATIVE

## 2023-01-30 ENCOUNTER — Other Ambulatory Visit: Payer: Self-pay | Admitting: *Deleted

## 2023-01-30 DIAGNOSIS — I8393 Asymptomatic varicose veins of bilateral lower extremities: Secondary | ICD-10-CM

## 2023-02-07 ENCOUNTER — Encounter: Payer: Self-pay | Admitting: Physician Assistant

## 2023-02-07 ENCOUNTER — Ambulatory Visit (INDEPENDENT_AMBULATORY_CARE_PROVIDER_SITE_OTHER): Payer: 59 | Admitting: Physician Assistant

## 2023-02-07 ENCOUNTER — Ambulatory Visit (HOSPITAL_COMMUNITY)
Admission: RE | Admit: 2023-02-07 | Discharge: 2023-02-07 | Disposition: A | Discharge status: Home / Self Care | Payer: 59 | Source: Ambulatory Visit | Attending: Vascular Surgery | Admitting: Vascular Surgery

## 2023-02-07 VITALS — BP 96/72 | HR 69 | Temp 98.7°F | Wt 148.0 lb

## 2023-02-07 DIAGNOSIS — M7989 Other specified soft tissue disorders: Secondary | ICD-10-CM

## 2023-02-07 DIAGNOSIS — I8393 Asymptomatic varicose veins of bilateral lower extremities: Secondary | ICD-10-CM | POA: Insufficient documentation

## 2023-02-07 DIAGNOSIS — I83891 Varicose veins of right lower extremities with other complications: Secondary | ICD-10-CM | POA: Diagnosis not present

## 2023-02-07 NOTE — Progress Notes (Signed)
VASCULAR & VEIN SPECIALISTS           OF [place]  History and Physical   Rhonda Gonzales is a 27 y.o. female who presents with right leg swelling and painful varicose veins.    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  She is on her feet from 7-7.  She states that after her shift, she has right leg swelling and achiness.  She states the swelling is better by the morning after being in bed overnight.  She has never had any vein procedures.  She does not have any hx of DVT.  She does have family hx as her mother has varicose veins.  She does not have skin color changes.  She does not wear compression but does wear a sleeve around her knee that gives her relief from the painful varicosities behind her right knee.  She does not have any issues with the left leg.   The pt is not on a statin for cholesterol management.  The pt is not on a daily aspirin.   Other AC:  none The pt is not on medication for hypertension.   The pt is  on medication for diabetes.   Tobacco hx:  never  Pt does not have family hx of AAA.  Past Medical History:  Diagnosis Date   Thyroid disease     No past surgical history on file.  Social History   Socioeconomic History   Marital status: Single    Spouse name: Not on file   Number of children: Not on file   Years of education: Not on file   Highest education level: Not on file  Occupational History   Not on file  Tobacco Use   Smoking status: Never   Smokeless tobacco: Never  Vaping Use   Vaping status: Never Used  Substance and Sexual Activity   Alcohol use: No   Drug use: No   Sexual activity: Never    Birth control/protection: Abstinence  Other Topics Concern   Not on file  Social History Narrative   Not on file   Social Determinants of Health   Financial Resource Strain: Low Risk  (12/11/2022)   Received from [facility]   Overall Financial Resource Strain (CARDIA)    Difficulty of Paying Living Expenses: Not very hard   Food Insecurity: No Food Insecurity (12/11/2022)   Received from [facility]   Hunger Vital Sign    Worried About Running Out of Food in the Last Year: Never true    Ran Out of Food in the Last Year: Never true  Transportation Needs: No Transportation Needs (12/11/2022)   Received from [facility] - Transportation    Lack of Transportation (Medical): No    Lack of Transportation (Non-Medical): No  Physical Activity: Sufficiently Active (12/11/2022)   Received from [facility]   Exercise Vital Sign    Days of Exercise per Week: 6 days    Minutes of Exercise per Session: 30 min  Stress: No Stress Concern Present (12/11/2022)   Received from [facility] - Occupational Stress Questionnaire    Feeling of Stress : Not at all  Social Connections: Socially Integrated (12/11/2022)   Received from [facility]   Social Network    How would you rate your social network (family, work, friends)?: Good participation with social networks  Intimate Partner Violence: Not  At Risk (12/11/2022)   Received from [facility]   HITS    Over the last 12 months how often did your partner physically hurt you?: 1    Over the last 12 months how often did your partner insult you or talk down to you?: 1    Over the last 12 months how often did your partner threaten you with physical harm?: 1    Over the last 12 months how often did your partner scream or curse at you?: 1     Family History  Problem Relation Age of Onset   Heart disease Paternal Grandmother    Diabetes Paternal Grandmother    Heart disease Paternal Grandfather     Current Outpatient Medications  Medication Sig Dispense Refill   Ascorbic Acid (VITAMIN C) 100 MG tablet Take 100 mg by mouth daily.     fluconazole (DIFLUCAN) 150 MG tablet Take 1 tablet (150 mg total) by mouth once a week. 2 tablet 0   IRON, FERROUS GLUCONATE, PO Take by mouth.     metroNIDAZOLE (METROGEL) 1 % gel  Apply topically daily. 45 g 0   Multiple Vitamins-Minerals (MULTIVITAMIN WITH MINERALS) tablet Take 1 tablet by mouth daily.     spironolactone (ALDACTONE) 100 MG tablet Take 100 mg by mouth daily.     thyroid (ARMOUR THYROID) 30 MG tablet Take 30 mg by mouth daily before breakfast.     vitamin B-12 (CYANOCOBALAMIN) 100 MCG tablet Take 50 mcg by mouth daily.     VITAMIN D, CHOLECALCIFEROL, PO Take by mouth.     No current facility-administered medications for this visit.    No Known Allergies  REVIEW OF SYSTEMS:   [X]  denotes positive finding, [ ]  denotes negative finding Cardiac  Comments:  Chest pain or chest pressure:    Shortness of breath upon exertion:    Short of breath when lying flat:    Irregular heart rhythm:        Vascular    Pain in calf, thigh, or hip brought on by ambulation:    Pain in feet at night that wakes you up from your sleep:     Blood clot in your veins:    Leg swelling:  x       Pulmonary    Oxygen at home:    Productive cough:     Wheezing:         Neurologic    Sudden weakness in arms or legs:     Sudden numbness in arms or legs:     Sudden onset of difficulty speaking or slurred speech:    Temporary loss of vision in one eye:     Problems with dizziness:         Gastrointestinal    Blood in stool:     Vomited blood:         Genitourinary    Burning when urinating:     Blood in urine:        Psychiatric    Major depression:         Hematologic    Bleeding problems:    Problems with blood clotting too easily:        Skin    Rashes or ulcers:        Constitutional    Fever or chills:      PHYSICAL EXAMINATION:  Today's Vitals   02/07/23 1329  BP: 96/72  Pulse: 69  Temp: 98.7 F (37.1 C)  TempSrc: Temporal  SpO2: 100%  Weight: 148 lb (67.1 kg)   Body mass index is 23.89 kg/m.   General:  WDWN in NAD; vital signs documented above Gait: Not observed HENT: WNL, normocephalic Pulmonary: normal non-labored breathing  without wheezing Cardiac: regular HR; without carotid bruits Abdomen: soft, NT, aortic pulse is not palpable Skin: without rashes Vascular Exam/Pulses:  Right Left  Radial 2+ (normal) 2+ (normal)  DP 2+ (normal) 2+ (normal)   Extremities: mild swelling RLE with varicosities present in the popliteal fossa  Neurologic: A&O X 3;  moving all extremities equally Psychiatric:  The pt has Normal affect.   Non-Invasive Vascular Imaging:   Venous duplex on 02/07/2023: Venous Reflux Times  +--------------+---------+------+-----------+------------+--------+  RIGHT        Reflux NoRefluxReflux TimeDiameter cmsComments                          Yes                                   +--------------+---------+------+-----------+------------+--------+  CFV                    yes   >1 second                       +--------------+---------+------+-----------+------------+--------+  FV mid        no                                              +--------------+---------+------+-----------+------------+--------+  Popliteal              yes   >1 second                       +--------------+---------+------+-----------+------------+--------+  GSV at SFJ              yes    >500 ms      0.79              +--------------+---------+------+-----------+------------+--------+  GSV prox thigh          yes    >500 ms      0.67              +--------------+---------+------+-----------+------------+--------+  GSV mid thigh           yes    >500 ms      0.55              +--------------+---------+------+-----------+------------+--------+  GSV dist thigh          yes    >500 ms      0.69              +--------------+---------+------+-----------+------------+--------+  GSV at knee   no                            0.69              +--------------+---------+------+-----------+------------+--------+  GSV prox calf no                             0.37              +--------------+---------+------+-----------+------------+--------+  GSV mid calf  no                            0.30              +--------------+---------+------+-----------+------------+--------+  SSV Pop Fossa no                            0.20              +--------------+---------+------+-----------+------------+--------+  SSV prox calf no                            0.19              +--------------+---------+------+-----------+------------+--------+  SSV mid calf  no                            0.24              +--------------+---------+------+-----------+------------+--------+  AASV O        no                            0.14              +--------------+---------+------+-----------+------------+--------+  AASV P        no                                    NV        +--------------+---------+------+-----------+------------+--------+      Summary:  Right:  - No evidence of deep vein thrombosis seen in the right lower extremity, from the common femoral through the popliteal veins.  - No evidence of superficial venous thrombosis in the right lower extremity.  - No evidence of superficial venous reflux seen in the right short saphenous vein.  - Venous reflux is noted in the right common femoral vein.  - Venous reflux is noted in the right greater saphenous vein in the thigh.  - Venous reflux is noted in the right popliteal vein.  - No reflux in the AASV.     Rhonda Gonzales is a 27 y.o. female who presents with: RLE swelling and painful varicosities     -pt has easily palpable pedal pulses bilaterally -pt does not have evidence of DVT.  Pt does have venous reflux in the right deep CFV and popliteal vein as well as the GSV at the SFJ and GSV in the thigh that measures .55cm-0.69cm.  -discussed with pt about wearing thigh high 20-30 mmHg compression stockings and pt was measured for these today.    -discussed the  importance of leg elevation and how to elevate properly - pt is advised to elevate their legs and a diagram is given to them to demonstrate for pt to lay flat on their back with knees elevated and slightly bent with their feet higher than their knees, which puts their feet higher than their heart for 15 minutes per day.  If pt cannot lay flat, advised to lay as flat as possible.  -pt is advised to continue as much walking as possible and avoid sitting or standing for long periods of time.  -discussed importance of maintaining a healthy weight as well as exercise and  that water aerobics would also be beneficial.  -handout with recommendations given -pt will f/u in 3 months for further evaluation   Doreatha Massed, [facility] [phone]  Clinic MD:  Randie Heinz

## 2023-05-02 ENCOUNTER — Encounter: Payer: Self-pay | Admitting: Vascular Surgery

## 2023-05-02 ENCOUNTER — Ambulatory Visit (INDEPENDENT_AMBULATORY_CARE_PROVIDER_SITE_OTHER): Payer: 59 | Admitting: Vascular Surgery

## 2023-05-02 VITALS — BP 99/65 | HR 62 | Temp 98.6°F | Resp 20 | Ht 66.0 in | Wt 144.0 lb

## 2023-05-02 DIAGNOSIS — I83891 Varicose veins of right lower extremities with other complications: Secondary | ICD-10-CM | POA: Diagnosis not present

## 2023-05-02 NOTE — Progress Notes (Signed)
Patient ID: Rhonda Gonzales, female   DOB: [birth year], 27 y.o.   MRN: [id number]  Reason for Consult: Follow-up   Referred by No ref. provider found  Subjective:     HPI:  Rhonda Gonzales is a 27 y.o. female without significant vascular disease.  She was recently evaluated for symptomatic right medial thigh and posterior knee varicosities.  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  She does not have any history of DVT.  Her mother does have significant varicosities no varicosities in her father side of the family.  No previous history of venous invention.  She has been compliant with thigh-high compression stockings.  She states that the varicosities are minimally symptomatic.  Past Medical History:  Diagnosis Date   Thyroid disease    Family History  Problem Relation Age of Onset   Heart disease Paternal Grandmother    Diabetes Paternal Grandmother    Heart disease Paternal Grandfather    History reviewed. No pertinent surgical history.  Short Social History:  Social History   Tobacco Use   Smoking status: Never   Smokeless tobacco: Never  Substance Use Topics   Alcohol use: No    No Known Allergies  Current Outpatient Medications  Medication Sig Dispense Refill   Ascorbic Acid (VITAMIN C) 100 MG tablet Take 100 mg by mouth daily.     vitamin B-12 (CYANOCOBALAMIN) 100 MCG tablet Take 50 mcg by mouth daily.     VITAMIN D, CHOLECALCIFEROL, PO Take by mouth.     No current facility-administered medications for this visit.    Review of Systems  Constitutional:  Constitutional negative. HENT: HENT negative.  Eyes: Eyes negative.  Respiratory: Respiratory negative.  Cardiovascular: Cardiovascular negative.  GI: Gastrointestinal negative.  Musculoskeletal: Positive for leg pain.  Skin: Skin negative.  Neurological: Neurological negative. Hematologic: Hematologic/lymphatic negative.  Psychiatric: Psychiatric negative.        Objective:   Objective   Vitals:   05/02/23 1014  BP: 99/65  Pulse: 62  Resp: 20  Temp: 98.6 F (37 C)  SpO2: 99%  Weight: 144 lb (65.3 kg)  Height: 5\' 6"  (1.676 m)   Body mass index is 23.24 kg/m.  Physical Exam HENT:     Head: Normocephalic.     Nose: Nose normal.     Mouth/Throat:     Mouth: Mucous membranes are moist.  Eyes:     Pupils: Pupils are equal, round, and reactive to light.  Cardiovascular:     Rate and Rhythm: Normal rate.     Pulses: Normal pulses.  Pulmonary:     Effort: Pulmonary effort is normal.  Abdominal:     General: Abdomen is flat.  Musculoskeletal:     Cervical back: Normal range of motion.     Right lower leg: No edema.     Left lower leg: No edema.     Comments: Small cluster of varicosities right medial thigh  Skin:    General: Skin is warm.     Capillary Refill: Capillary refill takes less than 2 seconds.  Neurological:     General: No focal deficit present.     Mental Status: She is alert.  Psychiatric:        Mood and Affect: Mood normal.     Data: Venous Reflux Times  +--------------+---------+------+-----------+------------+--------+  RIGHT        Reflux NoRefluxReflux TimeDiameter cmsComments  Yes                                   +--------------+---------+------+-----------+------------+--------+  CFV                    yes   >1 second                       +--------------+---------+------+-----------+------------+--------+  FV mid        no                                              +--------------+---------+------+-----------+------------+--------+  Popliteal              yes   >1 second                       +--------------+---------+------+-----------+------------+--------+  GSV at SFJ              yes    >500 ms      0.79              +--------------+---------+------+-----------+------------+--------+  GSV prox thigh          yes    >500 ms      0.67               +--------------+---------+------+-----------+------------+--------+  GSV mid thigh           yes    >500 ms      0.55              +--------------+---------+------+-----------+------------+--------+  GSV dist thigh          yes    >500 ms      0.69              +--------------+---------+------+-----------+------------+--------+  GSV at knee   no                            0.69              +--------------+---------+------+-----------+------------+--------+  GSV prox calf no                            0.37              +--------------+---------+------+-----------+------------+--------+  GSV mid calf  no                            0.30              +--------------+---------+------+-----------+------------+--------+  SSV Pop Fossa no                            0.20              +--------------+---------+------+-----------+------------+--------+  SSV prox calf no                            0.19              +--------------+---------+------+-----------+------------+--------+  SSV mid calf  no  0.24              +--------------+---------+------+-----------+------------+--------+  AASV O        no                            0.14              +--------------+---------+------+-----------+------------+--------+  AASV P        no                                    NV        +--------------+---------+------+-----------+------------+--------+         Summary:  Right:  - No evidence of deep vein thrombosis seen in the right lower extremity,  from the common femoral through the popliteal veins.  - No evidence of superficial venous thrombosis in the right lower  extremity.  - No evidence of superficial venous reflux seen in the right short  saphenous vein.  - Venous reflux is noted in the right common femoral vein.  - Venous reflux is noted in the right greater saphenous vein in the thigh.   - Venous reflux is noted in the right popliteal vein.  - No reflux in the AASV.      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  Given her minimal symptoms with improvement with compression stockings as well as her young age I have recommended against any intervention at this time.  We discussed the need for continued compression stockings.  She can see me on an as-needed basis.     Maeola Harman MD [facility] of [facility]

## 2023-12-14 ENCOUNTER — Encounter: Payer: Self-pay | Admitting: Advanced Practice Midwife
